# Patient Record
Sex: Female | Born: 1965 | Race: White | Hispanic: No | Smoking: Never smoker
Health system: Southern US, Community
[De-identification: ages and names within clinical notes are randomized; demographics above are authoritative.]

## PROBLEM LIST (undated history)

## (undated) DIAGNOSIS — D649 Anemia, unspecified: Secondary | ICD-10-CM

## (undated) DIAGNOSIS — R87612 Low grade squamous intraepithelial lesion on cytologic smear of cervix (LGSIL): Secondary | ICD-10-CM

## (undated) DIAGNOSIS — R0789 Other chest pain: Secondary | ICD-10-CM

## (undated) DIAGNOSIS — R911 Solitary pulmonary nodule: Secondary | ICD-10-CM

## (undated) DIAGNOSIS — E039 Hypothyroidism, unspecified: Secondary | ICD-10-CM

## (undated) DIAGNOSIS — R3129 Other microscopic hematuria: Secondary | ICD-10-CM

## (undated) DIAGNOSIS — E785 Hyperlipidemia, unspecified: Secondary | ICD-10-CM

## (undated) DIAGNOSIS — D72819 Decreased white blood cell count, unspecified: Secondary | ICD-10-CM

## (undated) HISTORY — PX: OTHER SURGICAL HISTORY: SHX169

## (undated) HISTORY — DX: Hyperlipidemia, unspecified: E78.5

## (undated) HISTORY — DX: Other microscopic hematuria: R31.29

## (undated) HISTORY — DX: Hypothyroidism, unspecified: E03.9

---

## 2006-12-09 ENCOUNTER — Ambulatory Visit: Payer: Self-pay | Admitting: Obstetrics and Gynecology

## 2007-12-21 ENCOUNTER — Ambulatory Visit: Payer: Self-pay | Admitting: Internal Medicine

## 2009-03-21 ENCOUNTER — Ambulatory Visit: Payer: Self-pay | Admitting: Obstetrics and Gynecology

## 2010-04-15 ENCOUNTER — Ambulatory Visit: Payer: Self-pay | Admitting: Obstetrics and Gynecology

## 2011-05-12 ENCOUNTER — Ambulatory Visit: Payer: Self-pay | Admitting: Obstetrics and Gynecology

## 2012-06-21 ENCOUNTER — Ambulatory Visit: Payer: Self-pay | Admitting: Obstetrics and Gynecology

## 2013-09-18 ENCOUNTER — Ambulatory Visit: Payer: Self-pay | Admitting: Obstetrics and Gynecology

## 2014-01-28 ENCOUNTER — Observation Stay: Payer: Self-pay | Admitting: Internal Medicine

## 2014-01-28 LAB — URINALYSIS, COMPLETE
BILIRUBIN, UR: NEGATIVE
Ketone: NEGATIVE
Leukocyte Esterase: NEGATIVE
Nitrite: NEGATIVE
PH: 6 (ref 4.5–8.0)
SPECIFIC GRAVITY: 1.031 (ref 1.003–1.030)
Squamous Epithelial: 6
WBC UR: 5 /HPF (ref 0–5)

## 2014-01-28 LAB — CBC
HCT: 38.4 % (ref 35.0–47.0)
HGB: 12.7 g/dL (ref 12.0–16.0)
MCH: 30.7 pg (ref 26.0–34.0)
MCHC: 33 g/dL (ref 32.0–36.0)
MCV: 93 fL (ref 80–100)
Platelet: 216 10*3/uL (ref 150–440)
RBC: 4.13 10*6/uL (ref 3.80–5.20)
RDW: 12 % (ref 11.5–14.5)
WBC: 16.1 10*3/uL — ABNORMAL HIGH (ref 3.6–11.0)

## 2014-01-28 LAB — COMPREHENSIVE METABOLIC PANEL
ALBUMIN: 3.5 g/dL (ref 3.4–5.0)
ALT: 14 U/L (ref 12–78)
Alkaline Phosphatase: 70 U/L
Anion Gap: 4 — ABNORMAL LOW (ref 7–16)
BUN: 13 mg/dL (ref 7–18)
Bilirubin,Total: 0.5 mg/dL (ref 0.2–1.0)
CALCIUM: 8.3 mg/dL — AB (ref 8.5–10.1)
Chloride: 105 mmol/L (ref 98–107)
Co2: 26 mmol/L (ref 21–32)
Creatinine: 0.95 mg/dL (ref 0.60–1.30)
EGFR (African American): >60
Glucose: 142 mg/dL — ABNORMAL HIGH (ref 65–99)
OSMOLALITY: 273 (ref 275–301)
Potassium: 3.5 mmol/L (ref 3.5–5.1)
SGOT(AST): 19 U/L (ref 15–37)
Sodium: 135 mmol/L — ABNORMAL LOW (ref 136–145)
TOTAL PROTEIN: 7.4 g/dL (ref 6.4–8.2)

## 2014-01-28 LAB — TROPONIN I: Troponin-I: <0.02 ng/mL

## 2014-01-28 LAB — CK TOTAL AND CKMB (NOT AT ARMC)
CK, Total: 68 U/L
CK-MB: <0.5 ng/mL — ABNORMAL LOW (ref 0.5–3.6)

## 2014-01-29 LAB — CBC WITH DIFFERENTIAL/PLATELET
BASOS ABS: 0 10*3/uL (ref 0.0–0.1)
BASOS PCT: 0.2 %
Eosinophil #: 0 10*3/uL (ref 0.0–0.7)
Eosinophil %: 0.1 %
HCT: 34.7 % — AB (ref 35.0–47.0)
HGB: 11.6 g/dL — ABNORMAL LOW (ref 12.0–16.0)
LYMPHS ABS: 1 10*3/uL (ref 1.0–3.6)
Lymphocyte %: 6.3 %
MCH: 30.7 pg (ref 26.0–34.0)
MCHC: 33.3 g/dL (ref 32.0–36.0)
MCV: 92 fL (ref 80–100)
MONO ABS: 1.1 x10 3/mm — AB (ref 0.2–0.9)
MONOS PCT: 7.2 %
NEUTROS ABS: 13.5 10*3/uL — AB (ref 1.4–6.5)
NEUTROS PCT: 86.2 %
Platelet: 184 10*3/uL (ref 150–440)
RBC: 3.76 10*6/uL — ABNORMAL LOW (ref 3.80–5.20)
RDW: 11.9 % (ref 11.5–14.5)
WBC: 15.6 10*3/uL — AB (ref 3.6–11.0)

## 2014-01-29 LAB — BASIC METABOLIC PANEL
Anion Gap: 5 — ABNORMAL LOW (ref 7–16)
BUN: 7 mg/dL (ref 7–18)
CO2: 25 mmol/L (ref 21–32)
Calcium, Total: 8.4 mg/dL — ABNORMAL LOW (ref 8.5–10.1)
Chloride: 109 mmol/L — ABNORMAL HIGH (ref 98–107)
Creatinine: 0.69 mg/dL (ref 0.60–1.30)
EGFR (African American): >60
Glucose: 98 mg/dL (ref 65–99)
OSMOLALITY: 275 (ref 275–301)
Potassium: 3.5 mmol/L (ref 3.5–5.1)
Sodium: 139 mmol/L (ref 136–145)

## 2014-01-30 LAB — CBC WITH DIFFERENTIAL/PLATELET
Basophil #: 0.1 10*3/uL (ref 0.0–0.1)
Basophil %: 0.8 %
Eosinophil #: 0.1 10*3/uL (ref 0.0–0.7)
Eosinophil %: 1.3 %
HCT: 35.4 % (ref 35.0–47.0)
HGB: 11.7 g/dL — AB (ref 12.0–16.0)
LYMPHS PCT: 22.6 %
Lymphocyte #: 1.7 10*3/uL (ref 1.0–3.6)
MCH: 30.8 pg (ref 26.0–34.0)
MCHC: 33.1 g/dL (ref 32.0–36.0)
MCV: 93 fL (ref 80–100)
Monocyte #: 0.7 x10 3/mm (ref 0.2–0.9)
Monocyte %: 9.7 %
NEUTROS PCT: 65.6 %
Neutrophil #: 4.9 10*3/uL (ref 1.4–6.5)
PLATELETS: 191 10*3/uL (ref 150–440)
RBC: 3.8 10*6/uL (ref 3.80–5.20)
RDW: 12.1 % (ref 11.5–14.5)
WBC: 7.5 10*3/uL (ref 3.6–11.0)

## 2014-02-02 LAB — CULTURE, BLOOD (SINGLE)

## 2014-09-17 DIAGNOSIS — E039 Hypothyroidism, unspecified: Secondary | ICD-10-CM | POA: Insufficient documentation

## 2014-12-20 ENCOUNTER — Ambulatory Visit
Admit: 2014-12-20 | Disposition: A | Discharge status: Home / Self Care | Payer: Self-pay | Attending: Obstetrics and Gynecology | Admitting: Obstetrics and Gynecology

## 2014-12-22 NOTE — Discharge Summary (Signed)
PATIENT NAME:  Kristen Mccann, Kristen Mccann MR#:  [id number] DATE OF BIRTH:  17-Jun-1966  DATE OF ADMISSION:  01/28/2014 DATE OF DISCHARGE:  01/30/2014  FINAL DIAGNOSES: 1.  Pneumonia, likely pneumococcal.  2.  Pleuritic chest pain secondary to pneumonia.   HISTORY AND PHYSICAL: Please see dictated admission history and physical.   HOSPITAL COURSE: The patient was admitted with pleuritic chest pain. She underwent CT scan by PE protocol which was negative for PE or mass, which revealed infiltration in the right middle lobe. Her clinical story of rapid onset with pleuritic chest pain and cough appeared to be most consistent with pneumococcal pneumonia. She was placed on Levaquin, defervesced, and really showed marked improvement over 48 hours. Sputum culture was attempted, however, the patient could not bring up anything to send off. The day prior to discharge, she was ambulating independently on room air and felt ready for discharge to home on the morning of 01/30/2014. At this time, she will be discharged in stable condition with physical activity up as tolerated. She will be off work for 1 week and she will follow up in our office in 1 to 2 weeks. Her diet will be regular.   DISCHARGE MEDICATIONS: 1.  Lovastatin 10 mg p.o. at bedtime.  2.  Vitamin B12 500 mcg p.o. daily.  3.  Levofloxacin 750 mg p.o. daily x8 days to complete a 10 day course.  4.  Doxycycline 100 mg p.o. b.i.d. x8 days for recent tick exposure with headache. 5.  Pro Air 2 puffs 4 times a day as needed for wheezing or cough.   ____________________________ Adin Hector, MD bjk:sb D: 01/30/2014 07:52:47 ET T: 01/30/2014 08:07:04 ET JOB#: [id number]  cc: Adin Hector, MD, <Dictator> Ramonita Lab MD ELECTRONICALLY SIGNED 01/31/2014 8:12

## 2014-12-22 NOTE — H&P (Signed)
PATIENT NAME:  Kristen Mccann, ABRAHA MR#:  [id number] DATE OF BIRTH:  Jan 15, 1966  DATE OF ADMISSION:  01/28/2014  ADMITTING PHYSICIAN: Gladstone Lighter, MD   PRIMARY CARE PHYSICIAN: Adin Hector, MD  CHIEF COMPLAINT:  Right-sided chest pain.   HISTORY OF PRESENT ILLNESS: Ms. Kristen Mccann is a very pleasant 49 year old Caucasian female with past medical history significant only for that hyperlipidemia, comes to the hospital secondary to 2-day history of chest pain. The patient said she had some chills at home and she had some intermittent right-sided chest pain about 2 days ago. She felt it was a muscle catch, it would go away, but got worse last night that she could not breathe this morning and presented to the hospital. She was noted to be significantly sinus tachycardic with heart rate greater than 120. Her white count was greater than 16,000. Chest x-ray was negative. Because she was on birth control pills, a CT chest was done which showed right middle lobe pneumonia but no pulmonary embolism. Because of her tachycardia and worsening pleurisy, the patient is being admitted under observation for pneumonia.    PAST MEDICAL HISTORY:  Hyperlipidemia.   PAST SURGICAL HISTORY: None.   MEDICATIONS AT HOME:   1.  B12 500 mcg p.o. daily.  2.  Lovastatin 10 mg p.o. daily.   ALLERGIES TO MEDICATIONS:  SULFA.   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 No history of any smoking or alcohol use.   FAMILY HISTORY: Significant for heart disease and diabetes in the family.   REVIEW OF SYSTEMS: CONSTITUTIONAL: No fever, fatigue or weakness.  EYES: No blurred vision, double vision, inflammation or glaucoma.  ENT: No tinnitus, ear pain, hearing loss, epistaxis or discharge.  RESPIRATORY: Positive for mild cough, dyspnea and chest pain. No wheeze or chronic obstructive pulmonary disease or hemoptysis.  CARDIOVASCULAR: Positive for pleuritic chest pain on the right side and  dyspnea on exertion. No orthopnea, palpitations or syncope.  GASTROINTESTINAL: No nausea, vomiting, diarrhea, abdominal pain, hematemesis or melena.  GENITOURINARY: No dysuria, hematuria, renal calculus, frequency or incontinence.  ENDOCRINE: No polyuria, nocturia, thyroid problems, heat or cold intolerance.  HEMATOLOGY: No anemia, easy bruising or bleeding.  SKIN: No acne, rash or lesions.  MUSCULOSKELETAL: No neck, back pain, arthritis or gout.  NEUROLOGIC: No numbness, weakness, cerebrovascular accident,  transient ischemic attack or seizures.  PSYCHOLOGICAL: No anxiety, insomnia, depression.   PHYSICAL EXAMINATION: VITAL SIGNS: Temperature 98.8 degrees Fahrenheit, pulse 120, respirations 20, blood pressure 112/53, pulse oximetry 98% on room air.  GENERAL: Well-built, well-nourished female lying in bed, not in any acute distress.  HEENT: Normocephalic, atraumatic. Pupils equal, round, reacting to light. Anicteric sclerae. Extraocular movements intact. Oropharynx clear without erythema, mass or exudates.  NECK: Supple. No thyromegaly, JVD or carotid bruits. No lymphadenopathy. LUNGS: Moving air bilaterally. No wheeze or crackles.  Mild rhonchi in right lower lobe. No use of accessory muscles for breathing.  CARDIOVASCULAR: S1, S2, regular rate and rhythm. No murmurs, rubs or gallops.  ABDOMEN: Soft, nontender, nondistended. No hepatosplenomegaly. Normal bowel sounds.  EXTREMITIES: No pedal edema. No clubbing or cyanosis; 2+ dorsalis pedis pulses palpable bilaterally.  SKIN: No acne, rash or lesions.  LYMPHATICS: No cervical or inguinal lymphadenopathy.  NEUROLOGICAL: Cranial nerves intact.  No motor or sensory deficits.  PSYCHOLOGICAL: The patient is awake, alert, oriented x 3.   LABORATORY, DIAGNOSTIC AND RADIOLOGICAL DATA: 1.  WBC 16.1, hemoglobin 12.7, hematocrit 38.4, platelet count 216.  2.  Sodium 135, potassium 3.5, chloride 105, bicarbonate 26, BUN 13, creatinine 0.95, glucose 142  and calcium of 8.3.  3.  ALT 14, AST 19, alkaline phosphatase 70, total bilirubin 0.5, albumin of 3.5.  4.  CK 68, CK-MB less than 0.5. Troponin is negative.  5.  Urinalysis negative for any infection.  6.  Chest x-ray showing clear lung fields. No acute cardiopulmonary disease.  7.  CT of the chest showing anterior right middle lobe pneumonia. No evidence of any PE other acute abnormality noted.  8.  EKG showing sinus tachycardia, heart rate of 119 with mild ST depressions noted in V4, V5 and V6.   ASSESSMENT AND PLAN: This is a 49 year old female with history of only hyperlipidemia, comes in pleuritic right-sided chest, dyspnea and tachycardia.  1.  Systemic inflammatory response syndrome with elevated WBC and tachycardia secondary to pneumonia. Admit under observation, IV fluids and antibiotics.  2.  Right middle lobe pneumonia. Blood cultures have been ordered. Started on IV Levaquin and monitor.  3.  Hyperlipidemia. Continue her statin.  4.  CODE STATUS: Full code.   TIME SPENT ON ADMISSION: 50 minutes.   ____________________________ Gladstone Lighter, MD rk:cs D: 01/28/2014 18:26:44 ET T: 01/28/2014 18:57:42 ET JOB#: [id number]  cc: Gladstone Lighter, MD, <Dictator> Adin Hector, MD Gladstone Lighter MD ELECTRONICALLY SIGNED 02/10/2014 14:14

## 2015-02-07 ENCOUNTER — Other Ambulatory Visit: Payer: BC Managed Care – PPO

## 2015-02-07 DIAGNOSIS — R3129 Other microscopic hematuria: Secondary | ICD-10-CM

## 2015-02-08 ENCOUNTER — Telehealth: Payer: Self-pay | Admitting: Urology

## 2015-02-08 DIAGNOSIS — R3129 Other microscopic hematuria: Secondary | ICD-10-CM

## 2015-02-08 LAB — URINALYSIS, COMPLETE
Bilirubin, UA: NEGATIVE
Glucose, UA: NEGATIVE
Ketones, UA: NEGATIVE
Leukocytes, UA: NEGATIVE
Nitrite, UA: NEGATIVE
Protein, UA: NEGATIVE
Specific Gravity, UA: 1.01 (ref 1.005–1.030)
Urobilinogen, Ur: 0.2 mg/dL (ref 0.2–1.0)
pH, UA: 7 (ref 5.0–7.5)

## 2015-02-08 LAB — MICROSCOPIC EXAMINATION: BACTERIA UA: NONE SEEN

## 2015-02-08 NOTE — Telephone Encounter (Signed)
Please let this patient know that her urine did have evidence of microscopic blood.  This has now been seen on 2 separate occasions.  As such, she now meets the criteria to proceed with hematuria workup.  I would like to have her get a CT urogram (order placed) and come in for an office cystoscopy following that study complete her workup. We will review the results of her CT scan at her next visit.    Please call her and let her know of this plan and forward this so that a cystoscopy can be scheduled in approximately 4 weeks following her CT urogram.  Hollice Espy, MD

## 2015-02-11 ENCOUNTER — Telehealth: Payer: Self-pay | Admitting: Urology

## 2015-02-11 NOTE — Telephone Encounter (Signed)
Pt called wanting to know if she was showing micro bleeding in her urine as she was told that she was going to be scheduled for a CT. She is wanting if micro bleeding has been showing up more than once in her urine. Best contact # [phone] 02/11/15 MAF

## 2015-02-11 NOTE — Telephone Encounter (Signed)
Kristen Mccann spoke with pt and will set up CT with f/u cysto. Cw,lpn

## 2015-02-12 NOTE — Telephone Encounter (Signed)
Spoke with pt and answered all questions accordingly. Pt will have CT beginning of July and f/u with Dr. Erlene Quan there after. Cw,lpn

## 2015-03-01 ENCOUNTER — Ambulatory Visit
Admission: RE | Admit: 2015-03-01 | Discharge: 2015-03-01 | Disposition: A | Discharge status: Home / Self Care | Payer: BC Managed Care – PPO | Source: Ambulatory Visit | Attending: Urology | Admitting: Urology

## 2015-03-01 DIAGNOSIS — R312 Other microscopic hematuria: Secondary | ICD-10-CM | POA: Insufficient documentation

## 2015-03-01 DIAGNOSIS — R911 Solitary pulmonary nodule: Secondary | ICD-10-CM | POA: Diagnosis not present

## 2015-03-01 DIAGNOSIS — R3129 Other microscopic hematuria: Secondary | ICD-10-CM

## 2015-03-01 MED ORDER — IOHEXOL 350 MG/ML SOLN
100.0000 mL | Freq: Once | INTRAVENOUS | Status: AC | PRN
  Administered 2015-03-01: 150 mL via INTRAVENOUS

## 2015-03-14 ENCOUNTER — Other Ambulatory Visit: Payer: Self-pay

## 2015-03-14 ENCOUNTER — Encounter: Payer: Self-pay | Admitting: Urology

## 2015-03-15 ENCOUNTER — Ambulatory Visit (INDEPENDENT_AMBULATORY_CARE_PROVIDER_SITE_OTHER): Payer: BC Managed Care – PPO | Admitting: Urology

## 2015-03-15 ENCOUNTER — Encounter: Payer: Self-pay | Admitting: *Deleted

## 2015-03-15 VITALS — BP 112/67 | HR 76 | Ht 64.0 in | Wt 115.4 lb

## 2015-03-15 DIAGNOSIS — D649 Anemia, unspecified: Secondary | ICD-10-CM | POA: Insufficient documentation

## 2015-03-15 DIAGNOSIS — R3129 Other microscopic hematuria: Secondary | ICD-10-CM | POA: Insufficient documentation

## 2015-03-15 DIAGNOSIS — R911 Solitary pulmonary nodule: Secondary | ICD-10-CM

## 2015-03-15 DIAGNOSIS — R312 Other microscopic hematuria: Secondary | ICD-10-CM | POA: Diagnosis not present

## 2015-03-15 DIAGNOSIS — D72819 Decreased white blood cell count, unspecified: Secondary | ICD-10-CM | POA: Insufficient documentation

## 2015-03-15 DIAGNOSIS — N393 Stress incontinence (female) (male): Secondary | ICD-10-CM

## 2015-03-15 DIAGNOSIS — E785 Hyperlipidemia, unspecified: Secondary | ICD-10-CM | POA: Insufficient documentation

## 2015-03-15 LAB — MICROSCOPIC EXAMINATION: Bacteria, UA: NONE SEEN

## 2015-03-15 LAB — URINALYSIS, COMPLETE
Bilirubin, UA: NEGATIVE
Glucose, UA: NEGATIVE
Ketones, UA: NEGATIVE
Leukocytes, UA: NEGATIVE
Nitrite, UA: NEGATIVE
Protein, UA: NEGATIVE
Specific Gravity, UA: 1.02 (ref 1.005–1.030)
Urobilinogen, Ur: 1 mg/dL (ref 0.2–1.0)
pH, UA: 7 (ref 5.0–7.5)

## 2015-03-15 MED ORDER — CIPROFLOXACIN HCL 500 MG PO TABS
500.0000 mg | ORAL_TABLET | Freq: Once | ORAL | Status: AC
  Administered 2015-03-15: 500 mg via ORAL

## 2015-03-15 MED ORDER — LIDOCAINE HCL 2 % EX GEL
1.0000 "application " | Freq: Once | CUTANEOUS | Status: AC
  Administered 2015-03-15: 1 via URETHRAL

## 2015-03-15 NOTE — Progress Notes (Signed)
03/15/2015 8:49 AM   Jenne Pane 1966-07-13 [id number]  Referring provider: Adin Hector, MD [place], [place] [id number]  Chief Complaint  Patient presents with  . Cysto    CTscan results    HPI: 49 year-old female referred by Dr. Ouida Sills for evaluation of  microscopic hematuria. She underwent a CT urogram which showed an incidental 6 mm lower lobe pulmonary nodule but otherwise no obvious GU pathology. The right distal ureter was incompletely opacified. She returns the office today for cystoscopy to complete her workup.  She denies a history of gross hematuria, flank pain, or kidney stones. No urinary tract infections. She denies any significant urinary symptoms including no urinary frequency, urgency, or urge incontinence. She does endorse rare stress incontinence with sneezing fits but does not wear any protective pads and is not bothered by this.  She denies any smoking history or smoking exposure.  She does have a family history of bladder cancer (father).    PMH: Past Medical History  Diagnosis Date  . Microscopic hematuria   . Hyperlipidemia   . Hypothyroidism     Surgical History: Past Surgical History  Procedure Laterality Date  . None      Home Medications:    Medication List       This list is accurate as of: 03/15/15  8:49 AM.  Always use your most recent med list.               levothyroxine 75 MCG tablet  Commonly known as:  SYNTHROID, LEVOTHROID  Take by mouth.     lovastatin 40 MG tablet  Commonly known as:  MEVACOR  take 1 tablet by mouth at bedtime     RA VITAMIN B-12 TR 1000 MCG Tbcr  Generic drug:  Cyanocobalamin  Take by mouth.        Allergies:  Allergies  Allergen Reactions  . Sulfa Antibiotics Rash    Family History: Family History  Problem Relation Age of Onset  . Diabetes Father   . Bladder Cancer Father   . Heart disease Father   . Heart disease Brother     Social  History:  reports that she has never smoked. She does not have any smokeless tobacco history on file. She reports that she drinks alcohol. She reports that she does not use illicit drugs.  Physical Exam: BP 112/67 mmHg  Pulse 76  Ht 5\' 4"  (1.626 m)  Wt 115 lb 6.4 oz (52.345 kg)  BMI 19.80 kg/m2  LMP 02/17/2015  Constitutional:  Alert and oriented, No acute distress. HEENT: Romeo AT, moist mucus membranes.  Trachea midline, no masses. Cardiovascular: No clubbing, cyanosis, or edema. Respiratory: Normal respiratory effort, no increased work of breathing. GI: Abdomen is soft, nontender, nondistended, no abdominal masses GU: No CVA tenderness. External genitalia and urethral meatus. Skin: No rashes, bruises or suspicious lesions. Neurologic: Grossly intact, no focal deficits, moving all 4 extremities. Psychiatric: Normal mood and affect.  Laboratory Data: Lab Results  Component Value Date   WBC 7.5 01/30/2014   HGB 11.7* 01/30/2014   HCT 35.4 01/30/2014   MCV 93 01/30/2014   PLT 191 01/30/2014    Lab Results  Component Value Date   CREATININE 0.69 01/29/2014   Urinalysis Urine dipstick shows positive for RBC's.  Micro exam: 3-10 RBC's per HPF.  Pertinent Imaging: CLINICAL DATA: Subsequent encounter for hematuria  EXAM: CT ABDOMEN AND PELVIS WITHOUT AND WITH CONTRAST  TECHNIQUE: Multidetector CT imaging of  the abdomen and pelvis was performed following the standard protocol before and following the bolus administration of intravenous contrast.  CONTRAST: 120mL OMNIPAQUE IOHEXOL 350 MG/ML SOLN  COMPARISON: None.  FINDINGS: Lower chest: 6 mm left lower lobe pulmonary nodule is seen on image 6 series 6.  Hepatobiliary: No focal abnormality within the liver parenchyma. There is no evidence for gallstones, gallbladder wall thickening, or pericholecystic fluid. No intrahepatic or extrahepatic biliary dilation.  Pancreas: No focal mass lesion. No dilatation of  the main duct. No intraparenchymal cyst. No peripancreatic edema.  Spleen: No splenomegaly. No focal mass lesion.  Adrenals/Urinary Tract: No adrenal nodule or mass. No evidence for stones in either kidney. No ureteral or bladder stones. Imaging after IV contrast administration shows no enhancing renal mass. Delayed images show no abnormality in either intrarenal collecting system or pelvis. The left ureter is well opacified throughout and shows no focal dilatation, irregular wall thickening, or filling defect. Distal half of the right ureter is not opacified, but within this limitation, no right ureteral abnormality is evident. Bladder lumen is well opacified with no focal bladder wall abnormality apparent.  Stomach/Bowel: Stomach is nondistended. No gastric wall thickening. No evidence of outlet obstruction. Duodenum is normally positioned as is the ligament of Treitz. No small bowel wall thickening. No small bowel dilatation. Terminal ileum is normal. Appendix is normal. No gross colonic mass. No colonic wall thickening. No substantial diverticular change.  Vascular/Lymphatic: There is abdominal aortic atherosclerosis without aneurysm. No lymphadenopathy in the abdomen. No pelvic sidewall lymphadenopathy.  Reproductive: Uterus is unremarkable. There is no adnexal mass.  Other: There is a trace amount of intraperitoneal free fluid which can be physiologic in a premenopausal female.  Musculoskeletal: Bone windows reveal no worrisome lytic or sclerotic osseous lesions. 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  IMPRESSION: No urinary findings to explain the patient's history of micro hematuria.  6 mm left lower lobe pulmonary nodule. If the patient is at high risk for bronchogenic carcinoma, follow-up chest CT at 6-12 months is recommended. If the patient is at low risk for bronchogenic carcinoma, follow-up chest CT at 12 months is recommended.  This recommendation follows the consensus statement: Guidelines for Management of Small Pulmonary Nodules Detected on CT Scans: A Statement from the [place] as published in Radiology 2005;237:395-400.   Electronically Signed  By: Misty Stanley M.D.  On: 03/01/2015 08:53   Cystoscopy Procedure Note  Patient identification was confirmed, informed consent was obtained, and patient was prepped using Betadine solution.  Lidocaine jelly was administered per urethral meatus.    Preoperative abx where received prior to procedure.    Procedure: - Flexible cystoscope introduced, without any difficulty.   - Thorough search of the bladder revealed:    normal urethral meatus    normal urothelium    no stones    no ulcers     no tumors    no urethral polyps    no trabeculation  - Ureteral orifices were normal in position and appearance.  Post-Procedure: - Patient tolerated the procedure well  Assessment & Plan:  49 year old never smoker with microscopic hematuria status post workup including CT urogram which shows no GU pathology (right distal ureter and incompletely opacified) negative cystoscopy today. Results were reviewed in detail.  1. Hematuria, microscopic Status post negative workup. We did discuss today that the right distal ureter was incompletely opacified, however, given her negative smoking history, she is extremely low risk for ureteral pathology. As such, we will  defer further workup as this does not seem warranted. Patient is agreeable with this plan. - Urinalysis, Complete - ciprofloxacin (CIPRO) tablet 500 mg; Take 1 tablet (500 mg total) by mouth once. - lidocaine (XYLOCAINE) 2 % jelly 1 application; Place 1 application into the urethra once.  2. SUI (stress urinary incontinence, female) Mild, minimal bother, no intervention needed   3. Pulmonary nodule Incidental 6 mm left lower lobe chest nodule discussed findings with patient. As per guidelines,  recommend follow-up CT in one year. - CT CHEST NODULE FOLLOW UP LOW DOSE W/O; Future   Return if symptoms worsen or fail to improve.  Hollice Espy, MD  [facility] [address], [place], [place] [address] [phone]

## 2015-05-30 DIAGNOSIS — R911 Solitary pulmonary nodule: Secondary | ICD-10-CM

## 2015-05-30 HISTORY — DX: Solitary pulmonary nodule: R91.1

## 2015-11-26 ENCOUNTER — Other Ambulatory Visit: Payer: Self-pay | Admitting: Obstetrics and Gynecology

## 2015-11-26 DIAGNOSIS — Z1231 Encounter for screening mammogram for malignant neoplasm of breast: Secondary | ICD-10-CM

## 2015-12-23 ENCOUNTER — Other Ambulatory Visit: Payer: Self-pay | Admitting: Obstetrics and Gynecology

## 2015-12-23 ENCOUNTER — Ambulatory Visit
Admission: RE | Admit: 2015-12-23 | Discharge: 2015-12-23 | Disposition: A | Discharge status: Home / Self Care | Payer: BC Managed Care – PPO | Source: Ambulatory Visit | Attending: Obstetrics and Gynecology | Admitting: Obstetrics and Gynecology

## 2015-12-23 DIAGNOSIS — Z1231 Encounter for screening mammogram for malignant neoplasm of breast: Secondary | ICD-10-CM

## 2016-02-25 ENCOUNTER — Ambulatory Visit
Admission: RE | Admit: 2016-02-25 | Discharge: 2016-02-25 | Disposition: A | Discharge status: Home / Self Care | Payer: BC Managed Care – PPO | Source: Ambulatory Visit | Attending: Urology | Admitting: Urology

## 2016-02-25 ENCOUNTER — Ambulatory Visit: Payer: BC Managed Care – PPO

## 2016-02-25 DIAGNOSIS — R911 Solitary pulmonary nodule: Secondary | ICD-10-CM | POA: Diagnosis present

## 2016-02-25 DIAGNOSIS — R918 Other nonspecific abnormal finding of lung field: Secondary | ICD-10-CM | POA: Insufficient documentation

## 2016-02-26 ENCOUNTER — Telehealth: Payer: Self-pay | Admitting: Radiology

## 2016-02-26 NOTE — Telephone Encounter (Signed)
Pt called requesting CT results. No appt has been scheduled. Pt may be contacted at [phone].

## 2016-03-02 NOTE — Telephone Encounter (Signed)
CT results reviewed.  No futher f/u needed.  LMOM stating as above.    Hollice Espy, MD

## 2016-03-18 ENCOUNTER — Ambulatory Visit: Payer: BC Managed Care – PPO | Admitting: Urology

## 2016-12-08 ENCOUNTER — Other Ambulatory Visit: Payer: Self-pay | Admitting: Internal Medicine

## 2016-12-08 DIAGNOSIS — Z1231 Encounter for screening mammogram for malignant neoplasm of breast: Secondary | ICD-10-CM

## 2016-12-31 ENCOUNTER — Ambulatory Visit: Payer: BC Managed Care – PPO

## 2017-01-21 ENCOUNTER — Ambulatory Visit
Admission: RE | Admit: 2017-01-21 | Discharge: 2017-01-21 | Disposition: A | Discharge status: Home / Self Care | Payer: BC Managed Care – PPO | Source: Ambulatory Visit | Attending: Internal Medicine | Admitting: Internal Medicine

## 2017-01-21 DIAGNOSIS — Z1231 Encounter for screening mammogram for malignant neoplasm of breast: Secondary | ICD-10-CM | POA: Diagnosis not present

## 2017-05-18 ENCOUNTER — Encounter: Payer: Self-pay | Admitting: *Deleted

## 2017-05-19 ENCOUNTER — Ambulatory Visit: Payer: BC Managed Care – PPO | Admitting: Anesthesiology

## 2017-05-19 ENCOUNTER — Encounter
Admission: RE | Disposition: A | Discharge status: Home / Self Care | Payer: Self-pay | Source: Ambulatory Visit | Attending: Unknown Physician Specialty

## 2017-05-19 ENCOUNTER — Encounter: Payer: Self-pay | Admitting: *Deleted

## 2017-05-19 ENCOUNTER — Ambulatory Visit
Admission: RE | Admit: 2017-05-19 | Discharge: 2017-05-19 | Disposition: A | Discharge status: Home / Self Care | Payer: BC Managed Care – PPO | Source: Ambulatory Visit | Attending: Unknown Physician Specialty | Admitting: Unknown Physician Specialty

## 2017-05-19 DIAGNOSIS — Q438 Other specified congenital malformations of intestine: Secondary | ICD-10-CM | POA: Diagnosis not present

## 2017-05-19 DIAGNOSIS — Z1211 Encounter for screening for malignant neoplasm of colon: Secondary | ICD-10-CM | POA: Insufficient documentation

## 2017-05-19 DIAGNOSIS — Z882 Allergy status to sulfonamides status: Secondary | ICD-10-CM | POA: Insufficient documentation

## 2017-05-19 DIAGNOSIS — Z8371 Family history of colonic polyps: Secondary | ICD-10-CM | POA: Insufficient documentation

## 2017-05-19 DIAGNOSIS — Z8249 Family history of ischemic heart disease and other diseases of the circulatory system: Secondary | ICD-10-CM | POA: Insufficient documentation

## 2017-05-19 DIAGNOSIS — E039 Hypothyroidism, unspecified: Secondary | ICD-10-CM | POA: Insufficient documentation

## 2017-05-19 DIAGNOSIS — K621 Rectal polyp: Secondary | ICD-10-CM | POA: Insufficient documentation

## 2017-05-19 DIAGNOSIS — E785 Hyperlipidemia, unspecified: Secondary | ICD-10-CM | POA: Diagnosis not present

## 2017-05-19 HISTORY — DX: Other chest pain: R07.89

## 2017-05-19 HISTORY — DX: Low grade squamous intraepithelial lesion on cytologic smear of cervix (LGSIL): R87.612

## 2017-05-19 HISTORY — DX: Anemia, unspecified: D64.9

## 2017-05-19 HISTORY — DX: Decreased white blood cell count, unspecified: D72.819

## 2017-05-19 HISTORY — DX: Solitary pulmonary nodule: R91.1

## 2017-05-19 HISTORY — PX: COLONOSCOPY WITH PROPOFOL: SHX5780

## 2017-05-19 LAB — POCT PREGNANCY, URINE: Preg Test, Ur: NEGATIVE

## 2017-05-19 SURGERY — COLONOSCOPY WITH PROPOFOL
Anesthesia: General

## 2017-05-19 MED ORDER — SODIUM CHLORIDE 0.9 % IV SOLN: INTRAVENOUS | Status: DC

## 2017-05-19 MED ORDER — PROPOFOL 10 MG/ML IV BOLUS
INTRAVENOUS | Status: AC
  Filled 2017-05-19: qty 20

## 2017-05-19 MED ORDER — ONDANSETRON HCL 4 MG/2ML IJ SOLN
INTRAMUSCULAR | Status: DC | PRN
  Administered 2017-05-19: 4 mg via INTRAVENOUS

## 2017-05-19 MED ORDER — PROPOFOL 10 MG/ML IV BOLUS
INTRAVENOUS | Status: DC | PRN
  Administered 2017-05-19: 30 mg via INTRAVENOUS
  Administered 2017-05-19: 20 mg via INTRAVENOUS
  Administered 2017-05-19: 30 mg via INTRAVENOUS

## 2017-05-19 MED ORDER — LIDOCAINE HCL (PF) 2 % IJ SOLN
INTRAMUSCULAR | Status: AC
  Filled 2017-05-19: qty 2

## 2017-05-19 MED ORDER — MIDAZOLAM HCL 2 MG/2ML IJ SOLN
INTRAMUSCULAR | Status: AC
  Filled 2017-05-19: qty 2

## 2017-05-19 MED ORDER — MIDAZOLAM HCL 2 MG/2ML IJ SOLN
INTRAMUSCULAR | Status: DC | PRN
  Administered 2017-05-19: 2 mg via INTRAVENOUS

## 2017-05-19 MED ORDER — LIDOCAINE HCL (CARDIAC) 20 MG/ML IV SOLN
INTRAVENOUS | Status: DC | PRN
  Administered 2017-05-19: 2 mL via INTRAVENOUS

## 2017-05-19 MED ORDER — PROPOFOL 500 MG/50ML IV EMUL
INTRAVENOUS | Status: DC | PRN
  Administered 2017-05-19: 120 ug/kg/min via INTRAVENOUS

## 2017-05-19 MED ORDER — SODIUM CHLORIDE 0.9 % IV SOLN
INTRAVENOUS | Status: DC
  Administered 2017-05-19: 11:00:00 via INTRAVENOUS

## 2017-05-19 MED ORDER — ONDANSETRON HCL 4 MG/2ML IJ SOLN
INTRAMUSCULAR | Status: AC
  Filled 2017-05-19: qty 2

## 2017-05-19 NOTE — Transfer of Care (Signed)
Immediate Anesthesia Transfer of Care Note  Patient: Kristen Mccann  Procedure(s) Performed: Procedure(s): COLONOSCOPY WITH PROPOFOL (N/A)  Patient Location: PACU  Anesthesia Type:General  Level of Consciousness: awake and alert   Airway & Oxygen Therapy: Patient Spontanous Breathing and Patient connected to nasal cannula oxygen  Post-op Assessment: Report given to RN and Post -op Vital signs reviewed and stable  Post vital signs: Reviewed  Last Vitals:  Vitals:   05/19/17 1004 05/19/17 1150  BP: (!) 94/57 (!) 92/49  Pulse: 79 84  Resp: 14 13  Temp: 36.7 C (!) 36.1 C  SpO2: 100% 100%    Last Pain:  Vitals:   05/19/17 1004  TempSrc: Tympanic         Complications: No apparent anesthesia complications

## 2017-05-19 NOTE — Anesthesia Preprocedure Evaluation (Addendum)
Anesthesia Evaluation  Patient identified by MRN, date of birth, ID band Patient awake    Reviewed: Allergy & Precautions  Airway Mallampati: II       Dental  (+) Teeth Intact   Pulmonary neg pulmonary ROS,    breath sounds clear to auscultation       Cardiovascular Exercise Tolerance: Good  Rhythm:Regular     Neuro/Psych negative neurological ROS  negative psych ROS   GI/Hepatic negative GI ROS, Neg liver ROS,   Endo/Other  Hypothyroidism   Renal/GU negative Renal ROS  negative genitourinary   Musculoskeletal   Abdominal Normal abdominal exam  (+)   Peds negative pediatric ROS (+)  Hematology  (+) anemia ,   Anesthesia Other Findings   Reproductive/Obstetrics                             Anesthesia Physical Anesthesia Plan  ASA: I  Anesthesia Plan: General   Post-op Pain Management:    Induction: Intravenous  PONV Risk Score and Plan: 0  Airway Management Planned: Natural Airway and Nasal Cannula  Additional Equipment:   Intra-op Plan:   Post-operative Plan:   Informed Consent: I have reviewed the patients History and Physical, chart, labs and discussed the procedure including the risks, benefits and alternatives for the proposed anesthesia with the patient or authorized representative who has indicated his/her understanding and acceptance.     Plan Discussed with: Surgeon  Anesthesia Plan Comments:         Anesthesia Quick Evaluation

## 2017-05-19 NOTE — Op Note (Signed)
[facility] Gastroenterology Patient Name: Kristen Mccann Procedure Date: 05/19/2017 11:01 AM MRN: [id number] Account #: [id number] Date of Birth: May 25, 1966 Admit Type: Outpatient Age: 51 Room: [facility] ENDO ROOM 3 Gender: Female Note Status: Finalized Procedure:            Colonoscopy Indications:          Colon cancer screening in patient at increased risk:                        Family history of 1st-degree relative with colon polyps Providers:            Manya Silvas, MD Referring MD:         Ramonita Lab, MD (Referring MD) Medicines:            Propofol per Anesthesia Complications:        No immediate complications. Procedure:            Pre-Anesthesia Assessment:                       - After reviewing the risks and benefits, the patient                        was deemed in satisfactory condition to undergo the                        procedure.                       After obtaining informed consent, the colonoscope was                        passed under direct vision. Throughout the procedure,                        the patient's blood pressure, pulse, and oxygen                        saturations were monitored continuously. The                        Colonoscope was introduced through the anus and                        advanced to the the cecum, identified by appendiceal                        orifice and ileocecal valve. The colonoscopy was                        performed with difficulty due to significant looping.                        The patient tolerated the procedure well. The quality                        of the bowel preparation was excellent. Findings:      The colon was extremely long requiring significant manuvering.      A diminutive polyp was found in the rectum. The polyp was sessile. The       polyp was removed with a jumbo cold  forceps. Resection and retrieval       were complete.      The exam was otherwise without abnormality.  Terminal ileum entered       showing normal ileum. Ileocecal valve normal. Impression:           - One diminutive polyp in the rectum, removed with a                        jumbo cold forceps. Resected and retrieved.                       - The examination was otherwise normal. Recommendation:       - Await pathology results. Manya Silvas, MD 05/19/2017 11:51:44 AM This report has been signed electronically. Number of Addenda: 0 Note Initiated On: 05/19/2017 11:01 AM Scope Withdrawal Time: 0 hours 14 minutes 24 seconds  Total Procedure Duration: 0 hours 29 minutes 33 seconds       [facility]

## 2017-05-19 NOTE — Transfer of Care (Signed)
Immediate Anesthesia Transfer of Care Note  Patient: Kristen Mccann  Procedure(s) Performed: Procedure(s): COLONOSCOPY WITH PROPOFOL (N/A)  Patient Location: PACU  Anesthesia Type:General  Level of Consciousness: awake and alert   Airway & Oxygen Therapy: Patient Spontanous Breathing and Patient connected to nasal cannula oxygen  Post-op Assessment: Report given to RN and Post -op Vital signs reviewed and stable  Post vital signs: Reviewed  Last Vitals:  Vitals:   05/19/17 1004  BP: (!) 94/57  Pulse: 79  Resp: 14  Temp: 36.7 C  SpO2: 100%    Last Pain:  Vitals:   05/19/17 1004  TempSrc: Tympanic         Complications: No apparent anesthesia complications

## 2017-05-19 NOTE — H&P (Signed)
   Primary Care Physician:  Adin Hector, MD Primary Gastroenterologist:  Dr. Vira Agar  Pre-Procedure History & Physical: HPI:  Kristen Mccann is a 51 y.o. female is here for an colonoscopy.   Past Medical History:  Diagnosis Date  . Anemia    unspecified  . Atypical chest pain   . Hyperlipidemia   . Hypothyroidism   . Incidental lung nodule 05/30/2015  . Leukopenia   . LGSIL of cervix of undetermined significance   . Microscopic hematuria     Past Surgical History:  Procedure Laterality Date  . none      Prior to Admission medications   Medication Sig Start Date End Date Taking? Authorizing Provider  Cyanocobalamin (RA VITAMIN B-12 TR) 1000 MCG TBCR Take by mouth.   Yes [provider]  ferrous sulfate 325 (65 FE) MG tablet Take 325 mg by mouth daily with breakfast.   Yes [provider]  OMEGA 3-6-9 FATTY ACIDS PO Take 400 Units by mouth daily.   Yes [provider]  levothyroxine (SYNTHROID, LEVOTHROID) 75 MCG tablet Take 75 mcg by mouth.  10/19/14 10/19/15  [provider]    Allergies as of 04/19/2017 - Review Complete 03/15/2015  Allergen Reaction Noted  . Sulfa antibiotics Rash 03/01/2015    Family History  Problem Relation Age of Onset  . Diabetes Father   . Bladder Cancer Father   . Heart disease Father   . Heart disease Brother     Social History   Social History  . Marital status: Divorced    Spouse name: N/A  . Number of children: N/A  . Years of education: N/A   Occupational History  . Not on file.   Social History Main Topics  . Smoking status: Never Smoker  . Smokeless tobacco: Never Used  . Alcohol use 0.0 oz/week     Comment: Occasional use  . Drug use: No  . Sexual activity: Not on file   Other Topics Concern  . Not on file   Social History Narrative  . No narrative on file    Review of Systems: See HPI, otherwise negative ROS  Physical Exam: BP (!) 94/57   Pulse 79   Temp 98.1 F  (36.7 C) (Tympanic)   Resp 14   Ht 5\' 4"  (1.626 m)   Wt 49.4 kg (109 lb)   SpO2 100%   BMI 18.71 kg/m  General:   Alert,  pleasant and cooperative in NAD Head:  Normocephalic and atraumatic. Neck:  Supple; no masses or thyromegaly. Lungs:  Clear throughout to auscultation.    Heart:  Regular rate and rhythm. Abdomen:  Soft, nontender and nondistended. Normal bowel sounds, without guarding, and without rebound.   Neurologic:  Alert and  oriented x4;  grossly normal neurologically.  Impression/Plan: Kristen Mccann is here for an colonoscopy to be performed for FH colon polyps.  Risks, benefits, limitations, and alternatives regarding  colonoscopy have been reviewed with the patient.  Questions have been answered.  All parties agreeable.   Gaylyn Cheers, MD  05/19/2017, 11:09 AM

## 2017-05-19 NOTE — Anesthesia Post-op Follow-up Note (Signed)
Anesthesia QCDR form completed.        

## 2017-05-20 ENCOUNTER — Encounter: Payer: Self-pay | Admitting: Unknown Physician Specialty

## 2017-05-20 LAB — SURGICAL PATHOLOGY

## 2017-07-06 NOTE — Anesthesia Postprocedure Evaluation (Signed)
Anesthesia Post Note  Patient: Kristen Mccann  Procedure(s) Performed: COLONOSCOPY WITH PROPOFOL (N/A )  Patient location during evaluation: PACU Anesthesia Type: General Level of consciousness: awake Pain management: pain level controlled Vital Signs Assessment: post-procedure vital signs reviewed and stable Respiratory status: spontaneous breathing Cardiovascular status: stable Anesthetic complications: no     Last Vitals:  Vitals:   05/19/17 1210 05/19/17 1220  BP: (!) 99/58 (!) 102/55  Pulse: 76 82  Resp: 13 11  Temp:    SpO2: 100% 100%    Last Pain:  Vitals:   05/20/17 0730  TempSrc:   PainSc: 0-No pain                 VAN STAVEREN,Deeric Cruise

## 2017-12-02 ENCOUNTER — Other Ambulatory Visit: Payer: Self-pay | Admitting: Obstetrics and Gynecology

## 2017-12-02 DIAGNOSIS — Z1231 Encounter for screening mammogram for malignant neoplasm of breast: Secondary | ICD-10-CM

## 2018-02-08 ENCOUNTER — Ambulatory Visit
Admission: RE | Admit: 2018-02-08 | Discharge: 2018-02-08 | Disposition: A | Discharge status: Home / Self Care | Payer: BC Managed Care – PPO | Source: Ambulatory Visit | Attending: Obstetrics and Gynecology | Admitting: Obstetrics and Gynecology

## 2018-02-08 ENCOUNTER — Encounter: Payer: Self-pay | Admitting: Radiology

## 2018-02-08 DIAGNOSIS — Z1231 Encounter for screening mammogram for malignant neoplasm of breast: Secondary | ICD-10-CM | POA: Diagnosis not present

## 2018-03-01 ENCOUNTER — Other Ambulatory Visit
Admission: RE | Admit: 2018-03-01 | Discharge: 2018-03-01 | Disposition: A | Discharge status: Home / Self Care | Payer: BC Managed Care – PPO | Source: Ambulatory Visit | Attending: Internal Medicine | Admitting: Internal Medicine

## 2018-03-01 DIAGNOSIS — R079 Chest pain, unspecified: Secondary | ICD-10-CM | POA: Diagnosis present

## 2018-03-01 LAB — TROPONIN I

## 2018-10-29 IMAGING — MG MM DIGITAL SCREENING BILAT W/ TOMO W/ CAD
8 series · 9 of 24 positions shown · non-contrast
Comparison: Previous exam(s).

CLINICAL DATA: Screening.

EXAM:
DIGITAL SCREENING BILATERAL MAMMOGRAM WITH TOMO AND CAD

[L CC synth-2D]
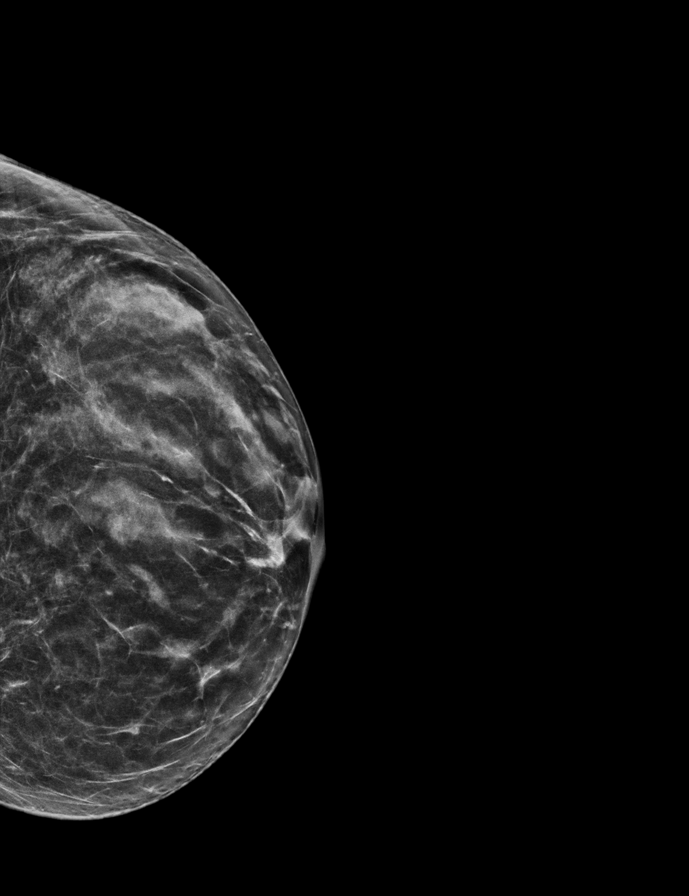

[R CC synth-2D]
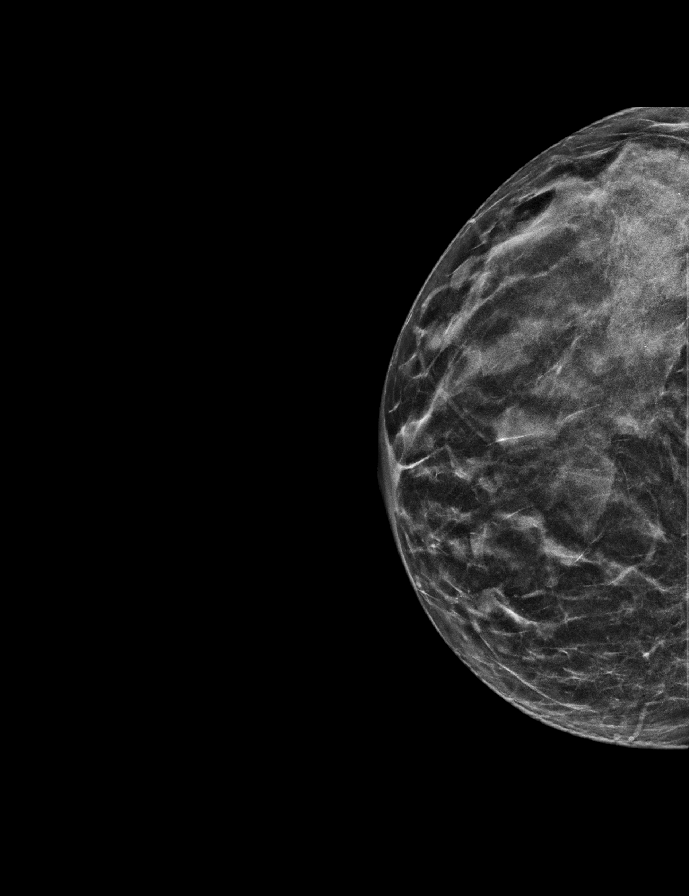

[L MLO synth-2D]
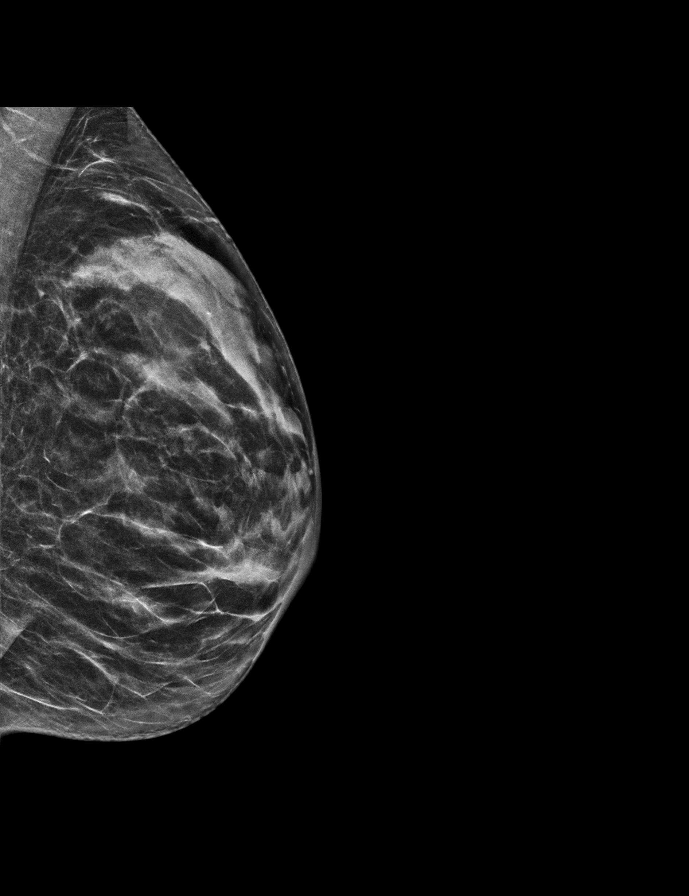

[R MLO synth-2D]
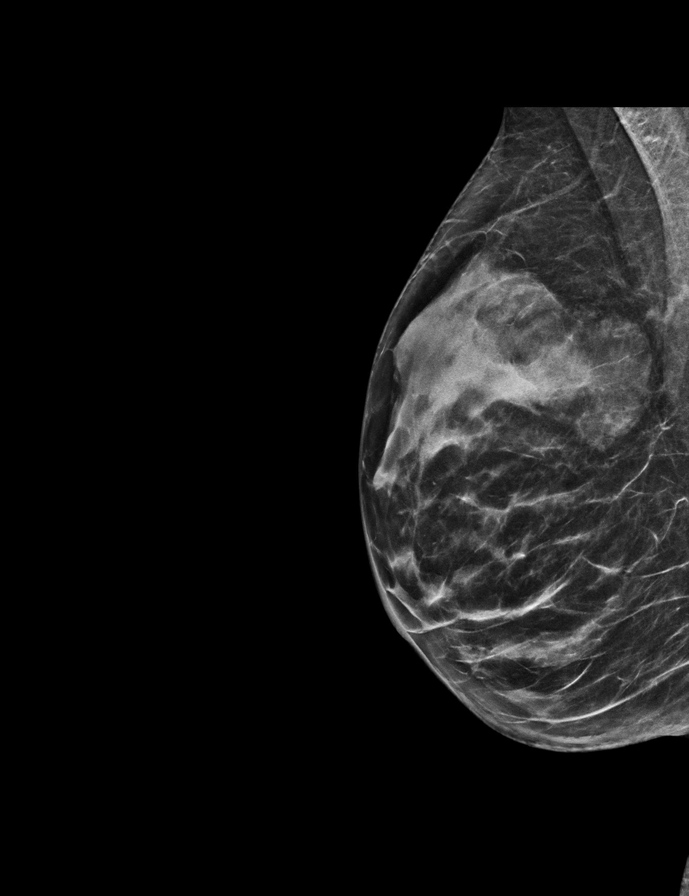

[L CC tomo · 2 of 53 frames shown]
[frame 18/53]
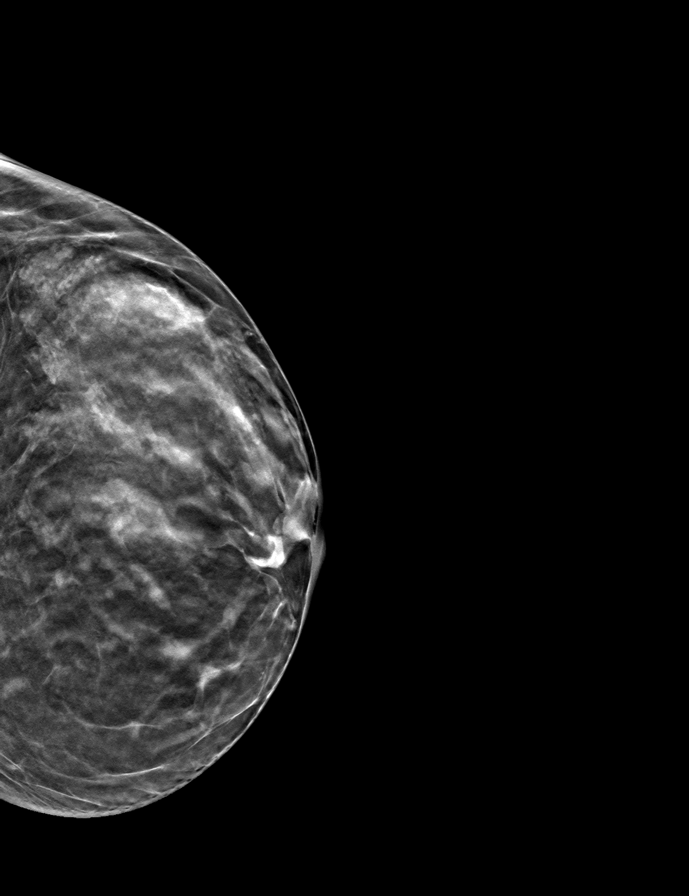
[frame 27/53]
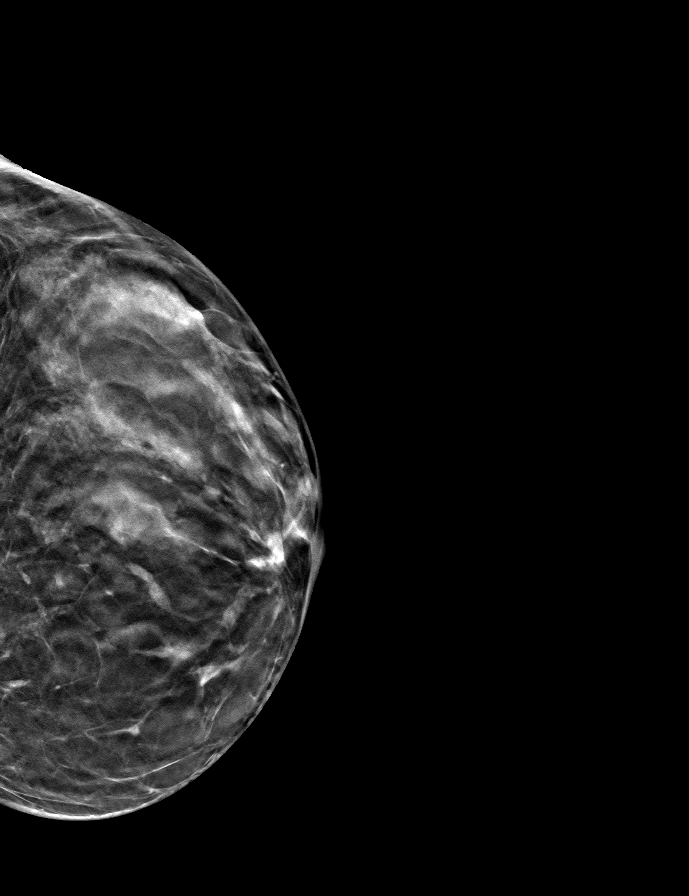

[R CC tomo · tomo slice 22/43.0]
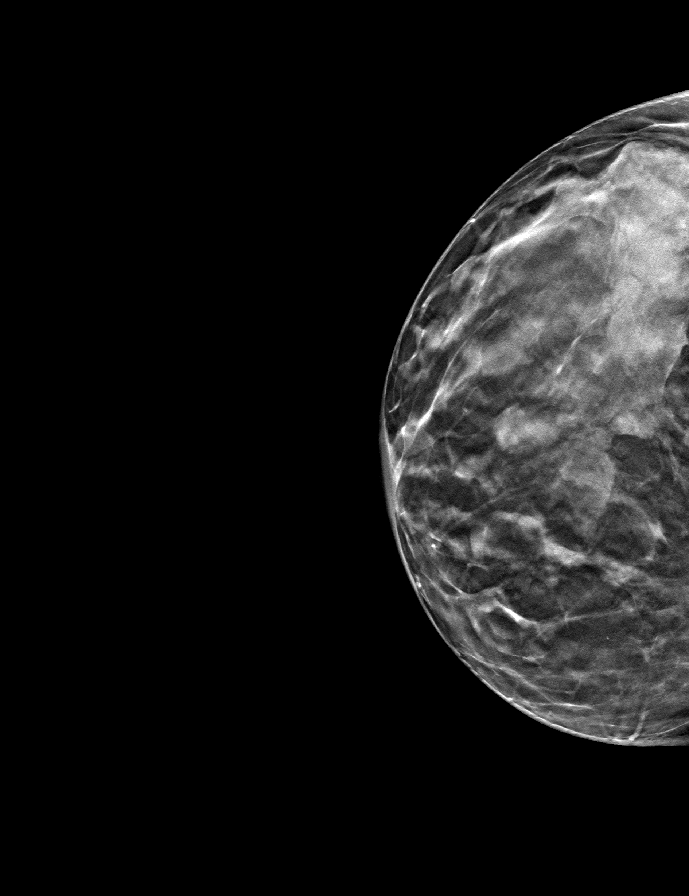

[R MLO tomo · tomo slice 29/56.0]
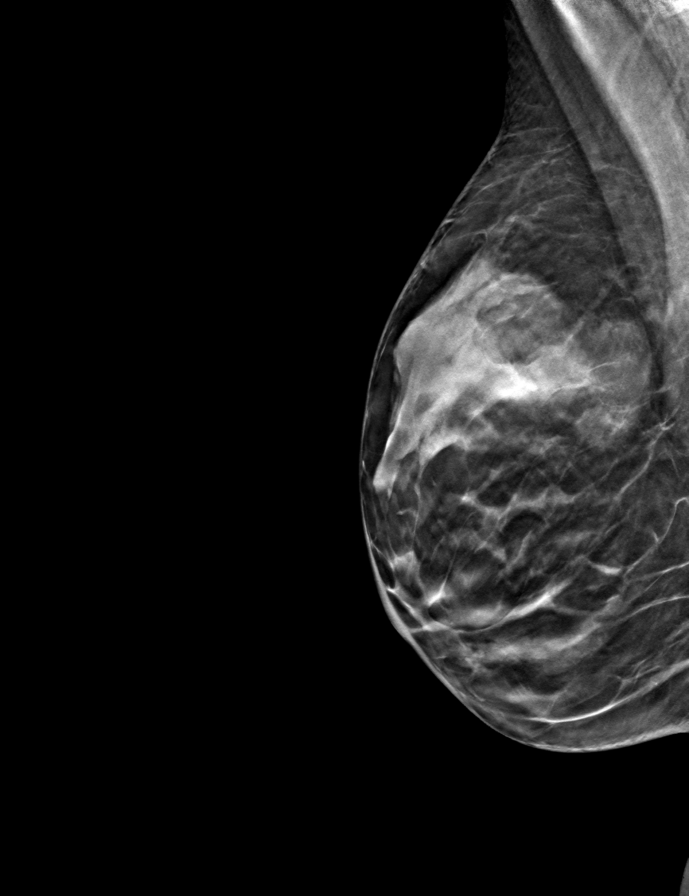

[L MLO tomo · tomo slice 26/51.0]
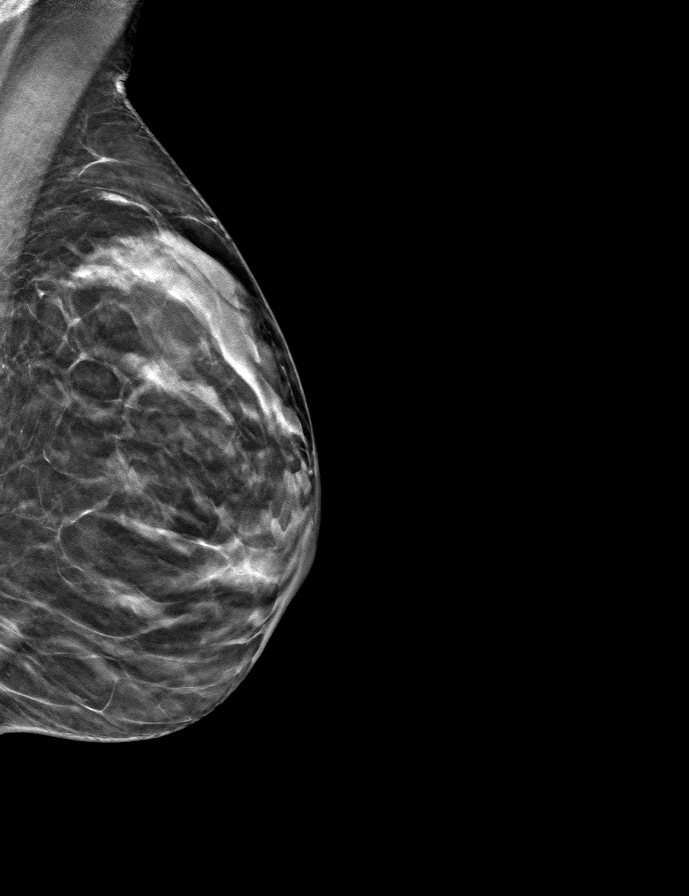

[9 of 24 positions shown; findings below may reference images not displayed]

ACR Breast Density Category c: The breast tissue is heterogeneously
dense, which may obscure small masses.
FINDINGS: There are no findings suspicious for malignancy. Images were
processed with CAD.
IMPRESSION: No mammographic evidence of malignancy. A result letter of this
screening mammogram will be mailed directly to the patient.

RECOMMENDATION:
Screening mammogram in one year. (Code:[id number])

BI-RADS CATEGORY  1: Negative.

## 2019-10-28 ENCOUNTER — Ambulatory Visit: Payer: BC Managed Care – PPO

## 2020-03-08 ENCOUNTER — Other Ambulatory Visit: Payer: Self-pay | Admitting: Internal Medicine

## 2020-03-08 DIAGNOSIS — Z1231 Encounter for screening mammogram for malignant neoplasm of breast: Secondary | ICD-10-CM

## 2020-04-02 ENCOUNTER — Other Ambulatory Visit: Payer: Self-pay

## 2020-04-02 ENCOUNTER — Ambulatory Visit
Admission: RE | Admit: 2020-04-02 | Discharge: 2020-04-02 | Disposition: A | Discharge status: Home / Self Care | Payer: BC Managed Care – PPO | Source: Ambulatory Visit | Attending: Internal Medicine | Admitting: Internal Medicine

## 2020-04-02 DIAGNOSIS — Z1231 Encounter for screening mammogram for malignant neoplasm of breast: Secondary | ICD-10-CM | POA: Diagnosis present

## 2020-04-18 ENCOUNTER — Other Ambulatory Visit
Admission: RE | Admit: 2020-04-18 | Discharge: 2020-04-18 | Disposition: A | Discharge status: Home / Self Care | Payer: BC Managed Care – PPO | Source: Ambulatory Visit | Attending: Infectious Diseases | Admitting: Infectious Diseases

## 2020-04-18 DIAGNOSIS — R9431 Abnormal electrocardiogram [ECG] [EKG]: Secondary | ICD-10-CM | POA: Diagnosis present

## 2020-04-18 DIAGNOSIS — R61 Generalized hyperhidrosis: Secondary | ICD-10-CM | POA: Insufficient documentation

## 2020-04-18 DIAGNOSIS — R42 Dizziness and giddiness: Secondary | ICD-10-CM | POA: Insufficient documentation

## 2020-04-18 LAB — TROPONIN I (HIGH SENSITIVITY): Troponin I (High Sensitivity): <2 ng/L (ref <18)

## 2021-02-04 ENCOUNTER — Other Ambulatory Visit: Payer: Self-pay | Admitting: Obstetrics and Gynecology

## 2021-02-04 DIAGNOSIS — N6459 Other signs and symptoms in breast: Secondary | ICD-10-CM

## 2021-02-05 ENCOUNTER — Other Ambulatory Visit: Payer: Self-pay | Admitting: Obstetrics and Gynecology

## 2021-02-05 DIAGNOSIS — N6459 Other signs and symptoms in breast: Secondary | ICD-10-CM

## 2021-02-27 ENCOUNTER — Ambulatory Visit
Admission: RE | Admit: 2021-02-27 | Discharge: 2021-02-27 | Disposition: A | Discharge status: Home / Self Care | Payer: BC Managed Care – PPO | Source: Ambulatory Visit | Attending: Obstetrics and Gynecology | Admitting: Obstetrics and Gynecology

## 2021-02-27 ENCOUNTER — Other Ambulatory Visit: Payer: Self-pay

## 2021-02-27 DIAGNOSIS — N6459 Other signs and symptoms in breast: Secondary | ICD-10-CM

## 2021-05-01 ENCOUNTER — Other Ambulatory Visit: Payer: Self-pay

## 2021-05-01 ENCOUNTER — Ambulatory Visit: Payer: BC Managed Care – PPO | Admitting: Dermatology

## 2021-05-01 DIAGNOSIS — Z1283 Encounter for screening for malignant neoplasm of skin: Secondary | ICD-10-CM | POA: Diagnosis not present

## 2021-05-01 DIAGNOSIS — D229 Melanocytic nevi, unspecified: Secondary | ICD-10-CM

## 2021-05-01 DIAGNOSIS — L918 Other hypertrophic disorders of the skin: Secondary | ICD-10-CM

## 2021-05-01 DIAGNOSIS — L578 Other skin changes due to chronic exposure to nonionizing radiation: Secondary | ICD-10-CM | POA: Diagnosis not present

## 2021-05-01 DIAGNOSIS — D1800 Hemangioma unspecified site: Secondary | ICD-10-CM

## 2021-05-01 DIAGNOSIS — L814 Other melanin hyperpigmentation: Secondary | ICD-10-CM

## 2021-05-01 DIAGNOSIS — L821 Other seborrheic keratosis: Secondary | ICD-10-CM

## 2021-05-01 NOTE — Progress Notes (Signed)
   New Patient Visit  Subjective  Kristen Mccann is a 55 y.o. female who presents for the following: TBSE (Total body exam today. No hx of skin cancer or dysplastic nevi. Nothing new or changing today that pt has noticed. ).  Patient here for full body skin exam and skin cancer screening.   Objective  Well appearing patient in no apparent distress; mood and affect are within normal limits.  A full examination was performed including scalp, head, eyes, ears, nose, lips, neck, chest, axillae, abdomen, back, buttocks, bilateral upper extremities, bilateral lower extremities, hands, feet, fingers, toes, fingernails, and toenails. All findings within normal limits unless otherwise noted below.  Left Eye Fleshy, skin-colored pedunculated papules.    Assessment & Plan  Skin tag Left Eye  Discussed that it is benign and can be removed in future if becomes bothersome. Pt defers treatment today.   Skin cancer screening  Lentigines - Scattered tan macules - Due to sun exposure - Benign-appering, observe - Recommend daily broad spectrum sunscreen SPF 30+ to sun-exposed areas, reapply every 2 hours as needed. - Call for any changes  Seborrheic Keratoses - Stuck-on, waxy, tan-brown papules and/or plaques  - Benign-appearing - Discussed benign etiology and prognosis. - Observe - Call for any changes  Melanocytic Nevi - Tan-brown and/or pink-flesh-colored symmetric macules and papules - Benign appearing on exam today - Observation - Call clinic for new or changing moles - Recommend daily use of broad spectrum spf 30+ sunscreen to sun-exposed areas.   Hemangiomas - Red papules - Discussed benign nature - Observe - Call for any changes  Actinic Damage - Chronic condition, secondary to cumulative UV/sun exposure - diffuse scaly erythematous macules with underlying dyspigmentation - Recommend daily broad spectrum sunscreen SPF 30+ to sun-exposed areas, reapply every 2 hours as  needed.  - Staying in the shade or wearing long sleeves, sun glasses (UVA+UVB protection) and wide brim hats (4-inch brim around the entire circumference of the hat) are also recommended for sun protection.  - Call for new or changing lesions.  Skin cancer screening performed today.  Return in about 1 year (around 05/01/2022) for 1-2 yr TBSE.  IHarriett Sine, CMA, am acting as scribe for Sarina Ser, MD.  Documentation: I have reviewed the above documentation for accuracy and completeness, and I agree with the above.  Sarina Ser, MD

## 2021-05-08 ENCOUNTER — Encounter: Payer: Self-pay | Admitting: Dermatology

## 2021-06-26 ENCOUNTER — Other Ambulatory Visit: Payer: Self-pay

## 2021-07-03 ENCOUNTER — Other Ambulatory Visit: Payer: Self-pay

## 2021-07-03 ENCOUNTER — Ambulatory Visit: Payer: BC Managed Care – PPO | Attending: Internal Medicine

## 2021-07-03 DIAGNOSIS — Z23 Encounter for immunization: Secondary | ICD-10-CM

## 2021-07-03 MED ORDER — PFIZER COVID-19 VAC BIVALENT 30 MCG/0.3ML IM SUSP
INTRAMUSCULAR | 0 refills | Status: AC
  Filled 2021-07-03: qty 0.3, 1d supply, fill #0

## 2021-07-03 NOTE — Progress Notes (Signed)
   Covid-19 Vaccination Clinic  Name:  Kristen Mccann    MRN: [id number] DOB: [birth year]  07/03/2021  Kristen Mccann was observed post Covid-19 immunization for 15 minutes without incident. She was provided with Vaccine Information Sheet and instruction to access the V-Safe system.   Kristen Mccann was instructed to call 911 with any severe reactions post vaccine: Difficulty breathing  Swelling of face and throat  A fast heartbeat  A bad rash all over body  Dizziness and weakness   Immunizations Administered     Name Date Dose VIS Date Route   Pfizer Covid-19 Vaccine Bivalent Booster 07/03/2021 12:04 PM 0.3 mL 04/30/2021 Intramuscular   Manufacturer: [employer]   Lot: [id number]   [place]: [phone]

## 2022-02-05 ENCOUNTER — Other Ambulatory Visit: Payer: Self-pay | Admitting: Obstetrics and Gynecology

## 2022-02-05 DIAGNOSIS — Z1231 Encounter for screening mammogram for malignant neoplasm of breast: Secondary | ICD-10-CM

## 2022-03-09 ENCOUNTER — Ambulatory Visit
Admission: RE | Admit: 2022-03-09 | Discharge: 2022-03-09 | Disposition: A | Discharge status: Home / Self Care | Payer: BC Managed Care – PPO | Source: Ambulatory Visit | Attending: Obstetrics and Gynecology | Admitting: Obstetrics and Gynecology

## 2022-03-09 DIAGNOSIS — Z1231 Encounter for screening mammogram for malignant neoplasm of breast: Secondary | ICD-10-CM | POA: Insufficient documentation

## 2022-05-06 ENCOUNTER — Ambulatory Visit: Payer: BC Managed Care – PPO | Admitting: Dermatology

## 2022-06-17 ENCOUNTER — Ambulatory Visit: Payer: BC Managed Care – PPO | Admitting: Dermatology

## 2022-06-17 DIAGNOSIS — Z1283 Encounter for screening for malignant neoplasm of skin: Secondary | ICD-10-CM

## 2022-06-17 DIAGNOSIS — L578 Other skin changes due to chronic exposure to nonionizing radiation: Secondary | ICD-10-CM | POA: Diagnosis not present

## 2022-06-17 DIAGNOSIS — L814 Other melanin hyperpigmentation: Secondary | ICD-10-CM | POA: Diagnosis not present

## 2022-06-17 DIAGNOSIS — D229 Melanocytic nevi, unspecified: Secondary | ICD-10-CM

## 2022-06-17 DIAGNOSIS — L821 Other seborrheic keratosis: Secondary | ICD-10-CM

## 2022-06-17 DIAGNOSIS — L820 Inflamed seborrheic keratosis: Secondary | ICD-10-CM

## 2022-06-17 NOTE — Patient Instructions (Signed)
Cryotherapy Aftercare  Wash gently with soap and water everyday.   Apply Vaseline and Band-Aid daily until healed.     Due to recent changes in healthcare laws, you may see results of your pathology and/or laboratory studies on MyChart before the doctors have had a chance to review them. We understand that in some cases there may be results that are confusing or concerning to you. Please understand that not all results are received at the same time and often the doctors may need to interpret multiple results in order to provide you with the best plan of care or course of treatment. Therefore, we ask that you please give us 2 business days to thoroughly review all your results before contacting the office for clarification. Should we see a critical lab result, you will be contacted sooner.   If You Need Anything After Your Visit  If you have any questions or concerns for your doctor, please call our main line at [phone] and press option 4 to reach your doctor's medical assistant. If no one answers, please leave a voicemail as directed and we will return your call as soon as possible. Messages left after 4 pm will be answered the following business day.   You may also send us a message via MyChart. We typically respond to MyChart messages within 1-2 business days.  For prescription refills, please ask your pharmacy to contact our office. Our fax number is [phone].  If you have an urgent issue when the clinic is closed that cannot wait until the next business day, you can page your doctor at the number below.    Please note that while we do our best to be available for urgent issues outside of office hours, we are not available 24/7.   If you have an urgent issue and are unable to reach us, you may choose to seek medical care at your doctor's office, retail clinic, urgent care center, or emergency room.  If you have a medical emergency, please immediately call 911 or go to the  emergency department.  Pager Numbers  - Dr. Kowalski: [phone]  - Dr. Moye: [phone]  - Dr. Stewart: [phone]  In the event of inclement weather, please call our main line at [phone] for an update on the status of any delays or closures.  Dermatology Medication Tips: Please keep the boxes that topical medications come in in order to help keep track of the instructions about where and how to use these. Pharmacies typically print the medication instructions only on the boxes and not directly on the medication tubes.   If your medication is too expensive, please contact our office at [phone] option 4 or send us a message through MyChart.   We are unable to tell what your co-pay for medications will be in advance as this is different depending on your insurance coverage. However, we may be able to find a substitute medication at lower cost or fill out paperwork to get insurance to cover a needed medication.   If a prior authorization is required to get your medication covered by your insurance company, please allow us 1-2 business days to complete this process.  Drug prices often vary depending on where the prescription is filled and some pharmacies may offer cheaper prices.  The website www.goodrx.com contains coupons for medications through different pharmacies. The prices here do not account for what the cost may be with help from insurance (it may be cheaper with your insurance), but the website can   give you the price if you did not use any insurance.  - You can print the associated coupon and take it with your prescription to the pharmacy.  - You may also stop by our office during regular business hours and pick up a GoodRx coupon card.  - If you need your prescription sent electronically to a different pharmacy, notify our office through [facility] MyChart or by phone at [phone] option 4.     Si Usted Necesita Algo Despus de Su Visita  Tambin puede  enviarnos un mensaje a travs de MyChart. Por lo general respondemos a los mensajes de MyChart en el transcurso de 1 a 2 das hbiles.  Para renovar recetas, por favor pida a su farmacia que se ponga en contacto con nuestra oficina. Nuestro nmero de fax es el [phone].  Si tiene un asunto urgente cuando la clnica est cerrada y que no puede esperar hasta el siguiente da hbil, puede llamar/localizar a su doctor(a) al nmero que aparece a continuacin.   Por favor, tenga en cuenta que aunque hacemos todo lo posible para estar disponibles para asuntos urgentes fuera del horario de oficina, no estamos disponibles las 24 horas del da, los 7 das de la semana.   Si tiene un problema urgente y no puede comunicarse con nosotros, puede optar por buscar atencin mdica  en el consultorio de su doctor(a), en una clnica privada, en un centro de atencin urgente o en una sala de emergencias.  Si tiene una emergencia mdica, por favor llame inmediatamente al 911 o vaya a la sala de emergencias.  Nmeros de bper  - Dr. Kowalski: [phone]  - Dra. Moye: [phone]  - Dra. Stewart: [phone]  En caso de inclemencias del tiempo, por favor llame a nuestra lnea principal al [phone] para una actualizacin sobre el estado de cualquier retraso o cierre.  Consejos para la medicacin en dermatologa: Por favor, guarde las cajas en las que vienen los medicamentos de uso tpico para ayudarle a seguir las instrucciones sobre dnde y cmo usarlos. Las farmacias generalmente imprimen las instrucciones del medicamento slo en las cajas y no directamente en los tubos del medicamento.   Si su medicamento es muy caro, por favor, pngase en contacto con nuestra oficina llamando al [phone] y presione la opcin 4 o envenos un mensaje a travs de MyChart.   No podemos decirle cul ser su copago por los medicamentos por adelantado ya que esto es diferente dependiendo de la cobertura de su seguro.  Sin embargo, es posible que podamos encontrar un medicamento sustituto a menor costo o llenar un formulario para que el seguro cubra el medicamento que se considera necesario.   Si se requiere una autorizacin previa para que su compaa de seguros cubra su medicamento, por favor permtanos de 1 a 2 das hbiles para completar este proceso.  Los precios de los medicamentos varan con frecuencia dependiendo del lugar de dnde se surte la receta y alguna farmacias pueden ofrecer precios ms baratos.  El sitio web www.goodrx.com tiene cupones para medicamentos de diferentes farmacias. Los precios aqu no tienen en cuenta lo que podra costar con la ayuda del seguro (puede ser ms barato con su seguro), pero el sitio web puede darle el precio si no utiliz ningn seguro.  - Puede imprimir el cupn correspondiente y llevarlo con su receta a la farmacia.  - Tambin puede pasar por nuestra oficina durante el horario de atencin regular y recoger una tarjeta de cupones de GoodRx.  -   Si necesita que su receta se enve electrnicamente a una farmacia diferente, informe a nuestra oficina a travs de MyChart de  o por telfono llamando al [phone] y presione la opcin 4.  

## 2022-06-17 NOTE — Progress Notes (Signed)
   Follow-Up Visit   Subjective  Kristen Mccann is a 56 y.o. female who presents for the following: Annual Exam. Patient c/o irritated spot around her left eye area.  The patient presents for Total-Body Skin Exam (TBSE) for skin cancer screening and mole check.  The patient has spots, moles and lesions to be evaluated, some may be new or changing and the patient has concerns that these could be cancer.   The following portions of the chart were reviewed this encounter and updated as appropriate:   Tobacco  Allergies  Meds  Problems  Med Hx  Surg Hx  Fam Hx     Review of Systems:  No other skin or systemic complaints except as noted in HPI or Assessment and Plan.  Objective  Well appearing patient in no apparent distress; mood and affect are within normal limits.  A full examination was performed including scalp, head, eyes, ears, nose, lips, neck, chest, axillae, abdomen, back, buttocks, bilateral upper extremities, bilateral lower extremities, hands, feet, fingers, toes, fingernails, and toenails. All findings within normal limits unless otherwise noted below.  left upper eyelid margin Stuck-on, waxy, tan-brown papule -- Discussed benign etiology and prognosis.    Assessment & Plan  Inflamed seborrheic keratosis left upper eyelid margin  Symptomatic, irritating, patient would like treated.   Destruction of lesion - left upper eyelid margin Complexity: simple   Destruction method: cryotherapy   Informed consent: discussed and consent obtained   Timeout:  patient name, date of birth, surgical site, and procedure verified Lesion destroyed using liquid nitrogen: Yes   Region frozen until ice ball extended beyond lesion: Yes   Outcome: patient tolerated procedure well with no complications   Post-procedure details: wound care instructions given    Lentigines - Scattered tan macules - Due to sun exposure - Benign-appearing, observe - Recommend daily broad spectrum sunscreen  SPF 30+ to sun-exposed areas, reapply every 2 hours as needed. - Call for any changes  Seborrheic Keratoses - Stuck-on, waxy, tan-brown papules and/or plaques  - Benign-appearing - Discussed benign etiology and prognosis. - Observe - Call for any changes  Melanocytic Nevi - Tan-brown and/or pink-flesh-colored symmetric macules and papules - Benign appearing on exam today - Observation - Call clinic for new or changing moles - Recommend daily use of broad spectrum spf 30+ sunscreen to sun-exposed areas.   Hemangiomas - Red papules - Discussed benign nature - Observe - Call for any changes  Actinic Damage - Chronic condition, secondary to cumulative UV/sun exposure - diffuse scaly erythematous macules with underlying dyspigmentation - Recommend daily broad spectrum sunscreen SPF 30+ to sun-exposed areas, reapply every 2 hours as needed.  - Staying in the shade or wearing long sleeves, sun glasses (UVA+UVB protection) and wide brim hats (4-inch brim around the entire circumference of the hat) are also recommended for sun protection.  - Call for new or changing lesions.  Skin cancer screening performed today.   Return in about 1 year (around 06/18/2023) for TBSE, SKs .  I, Kristen Mccann, CMA, am acting as scribe for Sarina Ser, MD .  Documentation: I have reviewed the above documentation for accuracy and completeness, and I agree with the above.  Sarina Ser, MD

## 2022-06-27 ENCOUNTER — Encounter: Payer: Self-pay | Admitting: Dermatology

## 2023-02-12 ENCOUNTER — Other Ambulatory Visit: Payer: Self-pay | Admitting: Obstetrics and Gynecology

## 2023-02-12 DIAGNOSIS — Z1231 Encounter for screening mammogram for malignant neoplasm of breast: Secondary | ICD-10-CM

## 2023-02-15 LAB — EXTERNAL GENERIC LAB PROCEDURE: COLOGUARD: NEGATIVE

## 2023-02-15 LAB — COLOGUARD: COLOGUARD: NEGATIVE

## 2023-03-11 ENCOUNTER — Ambulatory Visit
Admission: RE | Admit: 2023-03-11 | Discharge: 2023-03-11 | Disposition: A | Discharge status: Home / Self Care | Payer: BC Managed Care – PPO | Source: Ambulatory Visit | Attending: Obstetrics and Gynecology | Admitting: Obstetrics and Gynecology

## 2023-03-11 DIAGNOSIS — Z1231 Encounter for screening mammogram for malignant neoplasm of breast: Secondary | ICD-10-CM | POA: Insufficient documentation

## 2024-03-20 ENCOUNTER — Other Ambulatory Visit: Payer: Self-pay | Admitting: Internal Medicine

## 2024-03-20 DIAGNOSIS — Z1231 Encounter for screening mammogram for malignant neoplasm of breast: Secondary | ICD-10-CM

## 2024-03-22 ENCOUNTER — Ambulatory Visit
Admission: RE | Admit: 2024-03-22 | Discharge: 2024-03-22 | Disposition: A | Discharge status: Home / Self Care | Payer: Self-pay | Source: Ambulatory Visit | Attending: Internal Medicine | Admitting: Internal Medicine

## 2024-03-22 DIAGNOSIS — Z1231 Encounter for screening mammogram for malignant neoplasm of breast: Secondary | ICD-10-CM | POA: Insufficient documentation

## 2024-04-06 ENCOUNTER — Encounter: Payer: Self-pay | Admitting: Dermatology

## 2024-04-06 ENCOUNTER — Ambulatory Visit: Payer: Self-pay | Admitting: Dermatology

## 2024-04-06 DIAGNOSIS — D1801 Hemangioma of skin and subcutaneous tissue: Secondary | ICD-10-CM | POA: Diagnosis not present

## 2024-04-06 DIAGNOSIS — L820 Inflamed seborrheic keratosis: Secondary | ICD-10-CM | POA: Diagnosis not present

## 2024-04-06 DIAGNOSIS — L578 Other skin changes due to chronic exposure to nonionizing radiation: Secondary | ICD-10-CM

## 2024-04-06 DIAGNOSIS — W908XXA Exposure to other nonionizing radiation, initial encounter: Secondary | ICD-10-CM

## 2024-04-06 DIAGNOSIS — D229 Melanocytic nevi, unspecified: Secondary | ICD-10-CM

## 2024-04-06 DIAGNOSIS — L814 Other melanin hyperpigmentation: Secondary | ICD-10-CM | POA: Diagnosis not present

## 2024-04-06 DIAGNOSIS — Z7189 Other specified counseling: Secondary | ICD-10-CM

## 2024-04-06 DIAGNOSIS — B079 Viral wart, unspecified: Secondary | ICD-10-CM | POA: Diagnosis not present

## 2024-04-06 DIAGNOSIS — Z1283 Encounter for screening for malignant neoplasm of skin: Secondary | ICD-10-CM

## 2024-04-06 NOTE — Progress Notes (Signed)
 Follow-Up Visit   Subjective  Kristen Mccann is a 58 y.o. female who presents for the following: Skin Cancer Screening and Full Body Skin Exam  The patient presents for Total-Body Skin Exam (TBSE) for skin cancer screening and mole check. The patient has spots, moles and lesions to be evaluated, some may be new or changing and the patient may have concern these could be cancer.  Patient with a few spots at back that don't hurt but feel weird, a pea size bump at right arm near elbow.  The following portions of the chart were reviewed this encounter and updated as appropriate: medications, allergies, medical history  Review of Systems:  No other skin or systemic complaints except as noted in HPI or Assessment and Plan.  Objective  Well appearing patient in no apparent distress; mood and affect are within normal limits.  A full examination was performed including scalp, head, eyes, ears, nose, lips, neck, chest, axillae, abdomen, back, buttocks, bilateral upper extremities, bilateral lower extremities, hands, feet, fingers, toes, fingernails, and toenails. All findings within normal limits unless otherwise noted below.   Relevant physical exam findings are noted in the Assessment and Plan.  R index finger Verrucous papules -- Discussed viral etiology and contagion.  back x 3 (3) Erythematous stuck-on, waxy papule or plaque  Assessment & Plan   SKIN CANCER SCREENING PERFORMED TODAY.  ACTINIC DAMAGE - Chronic condition, secondary to cumulative UV/sun exposure - diffuse scaly erythematous macules with underlying dyspigmentation - Recommend daily broad spectrum sunscreen SPF 30+ to sun-exposed areas, reapply every 2 hours as needed.  - Staying in the shade or wearing long sleeves, sun glasses (UVA+UVB protection) and wide brim hats (4-inch brim around the entire circumference of the hat) are also recommended for sun protection.  - Call for new or changing lesions.  LENTIGINES,  SEBORRHEIC KERATOSES, HEMANGIOMAS - Benign normal skin lesions - Benign-appearing - Call for any changes  MELANOCYTIC NEVI - Tan-brown and/or pink-flesh-colored symmetric macules and papules - Benign appearing on exam today - Observation - Call clinic for new or changing moles - Recommend daily use of broad spectrum spf 30+ sunscreen to sun-exposed areas.   VIRAL WARTS, UNSPECIFIED TYPE R index finger Viral Wart (HPV) Counseling  Discussed viral / HPV (Human Papilloma Virus) etiology and risk of spread /infectivity to other areas of body as well as to other people.  Multiple treatments and methods may be required to clear warts and it is possible treatment may not be successful.  Treatment risks include discoloration; scarring and there is still potential for wart recurrence. Destruction of lesion - R index finger Complexity: simple   Destruction method: cryotherapy   Informed consent: discussed and consent obtained   Timeout:  patient name, date of birth, surgical site, and procedure verified Lesion destroyed using liquid nitrogen: Yes   Region frozen until ice ball extended beyond lesion: Yes   Outcome: patient tolerated procedure well with no complications   Post-procedure details: wound care instructions given    INFLAMED SEBORRHEIC KERATOSIS (3) back x 3 (3) Symptomatic, irritating, patient would like treated.  Benign-appearing.  Call clinic for new or changing lesions.   Destruction of lesion - back x 3 (3) Complexity: simple   Destruction method: cryotherapy   Informed consent: discussed and consent obtained   Timeout:  patient name, date of birth, surgical site, and procedure verified Lesion destroyed using liquid nitrogen: Yes   Region frozen until ice ball extended beyond lesion: Yes  Outcome: patient tolerated procedure well with no complications   Post-procedure details: wound care instructions given    Return in about 2 years (around 04/06/2026) for TBSE, with Dr.  LOIS LILLETTE Lonell Lorren, RMA, am acting as scribe for Alm Rhyme, MD .   Documentation: I have reviewed the above documentation for accuracy and completeness, and I agree with the above.  Alm Rhyme, MD

## 2024-04-06 NOTE — Patient Instructions (Signed)

## 2026-02-27 ENCOUNTER — Ambulatory Visit: Admitting: Dermatology
